# Patient Record
Sex: Female | Born: 2000 | Race: White | Hispanic: No | Marital: Single | Smoking: Never smoker
Health system: Southern US, Community
[De-identification: ages and names within clinical notes are randomized; demographics above are authoritative.]

---
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
## 2022-05-05 IMAGING — CR DG ABDOMEN 2V
1 series · 3 of 3 positions shown · non-contrast
Comparison: None.

CLINICAL DATA: Pain

EXAM:
ABDOMEN - 2 VIEW

[Series 1: dg abd 2 views · 0.14mm/px · 3 of 3 slices shown]
[im 1/3]
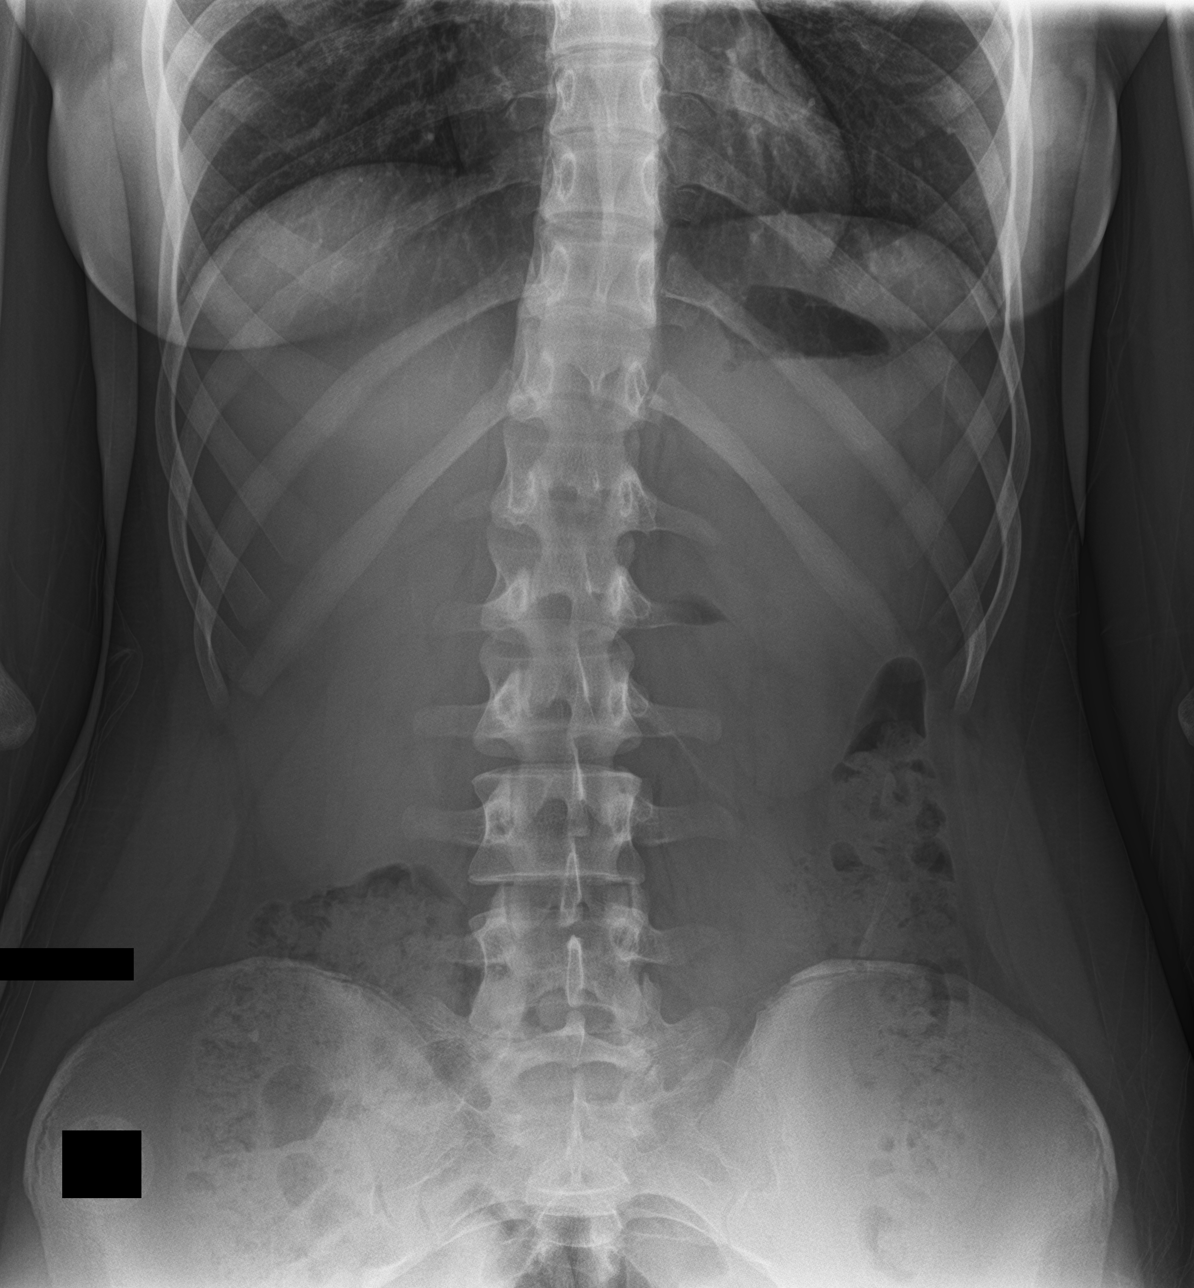
[im 2/3]
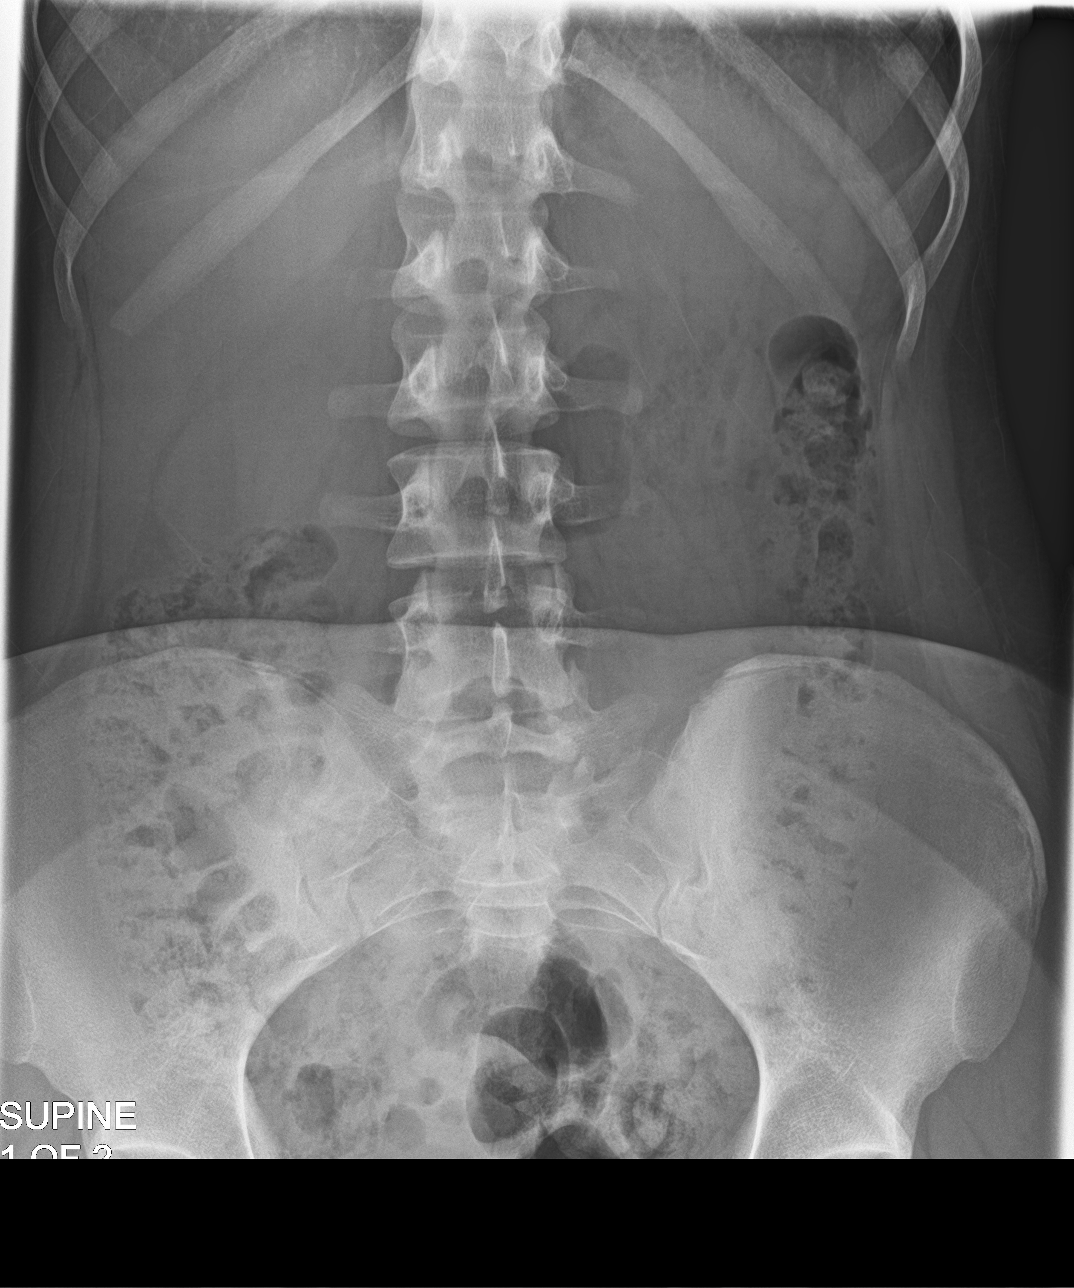
[im 3/3]
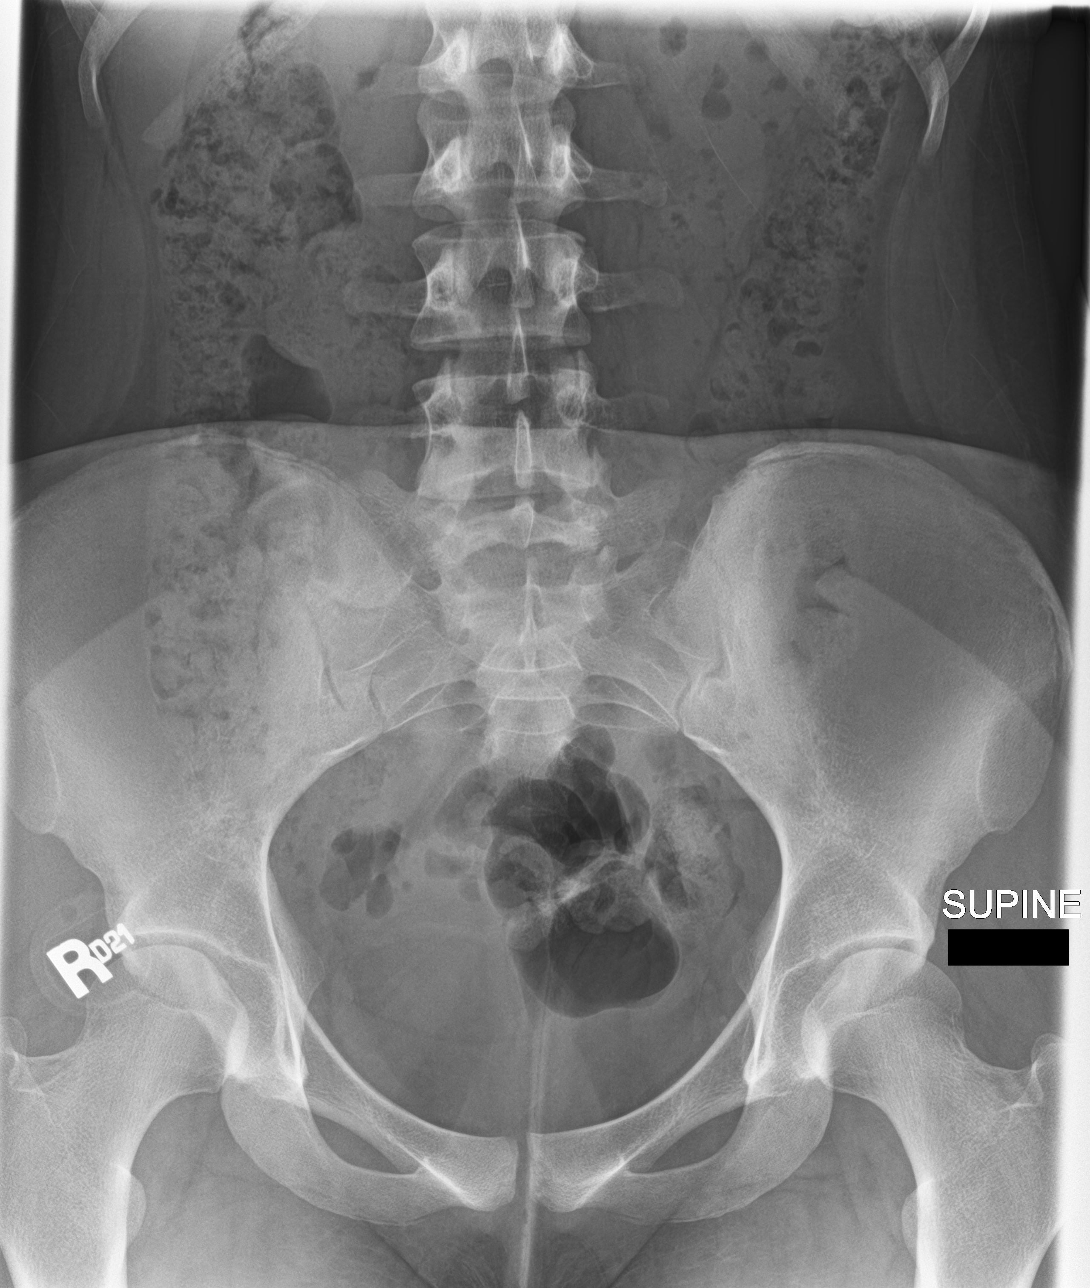

[3 of 3 positions shown; findings below may reference images not displayed]

FINDINGS: Air and stool-filled nondilated loops of bowel. Moderate to large
colonic stool burden diffusely throughout the colon. No free air.
Visualized lung bases are unremarkable. Riblets at T12 with partial
sacralization of L5 with a RIGHT-sided assimilation joint.
IMPRESSION: Nonobstructive bowel gas pattern. Moderate to large colonic stool
burden diffusely throughout the colon.

## 2022-07-28 ENCOUNTER — Other Ambulatory Visit: Payer: Self-pay

## 2022-07-28 ENCOUNTER — Encounter: Payer: Self-pay | Admitting: Family Medicine

## 2022-07-28 ENCOUNTER — Ambulatory Visit (INDEPENDENT_AMBULATORY_CARE_PROVIDER_SITE_OTHER): Payer: BC Managed Care – PPO | Admitting: Family Medicine

## 2022-07-28 VITALS — BP 121/82 | HR 91 | Temp 98.2°F | Ht 66.93 in | Wt 168.0 lb

## 2022-07-28 DIAGNOSIS — R112 Nausea with vomiting, unspecified: Secondary | ICD-10-CM | POA: Diagnosis not present

## 2022-07-28 DIAGNOSIS — F419 Anxiety disorder, unspecified: Secondary | ICD-10-CM

## 2022-07-28 DIAGNOSIS — F32A Depression, unspecified: Secondary | ICD-10-CM | POA: Diagnosis not present

## 2022-07-28 MED ORDER — ONDANSETRON HCL 4 MG PO TABS: 4.0000 mg | ORAL_TABLET | Freq: Three times a day (TID) | ORAL | 2 refills | Status: DC | PRN

## 2022-07-28 NOTE — Progress Notes (Signed)
[address] [place], [place] [address] Phone: [phone] Fax: [phone]   Office Visit Note  Patient Name: Megan Vang  Date of [id number]  Med Rec number [id number]  Date of Service: 07/28/2022  Patient has no allergy information on record.  No chief complaint on file.    Has history of anxiety and sees a therapist but anxiety getting worse Nauseous and vomiting Has missed some classes due to the nausea  Has had nausea like this before with anxiety since beginning of high school but has never been this bad before - eating may make it worse, can last weeks or months Has been taking lexapro for about 18 months - sees her primary care for this Even if controls anxiety it does not help the nausea  Lexapro helps the depression but not the anxiety Sees therapist virtually  Nausea may wake her at night Has tried zofran, dramamine, (zofran helped)   Appetite varies depending on anxiety - also has IBS (mixed)  Has not had blood work for about 3 years    Current Medication:  No outpatient encounter medications on file as of 07/28/2022.   No facility-administered encounter medications on file as of 07/28/2022.      Medical History: Anxiety and depression IBS (mixed)   Vital Signs: BP 121/82   Pulse 91   Temp 98.2 F (36.8 C) (Tympanic)   Ht 5' 6.93" (1.7 m)   Wt 168 lb (76.2 kg)   SpO2 98%   BMI 26.37 kg/m    Review of Systems  Constitutional:  Positive for activity change and appetite change.  Gastrointestinal:  Positive for nausea and vomiting. Negative for constipation and diarrhea.  Psychiatric/Behavioral:  The patient is nervous/anxious.     Physical Exam Vitals reviewed.  Constitutional:      Appearance: Normal appearance.  Abdominal:     General: Abdomen is flat. Bowel sounds are normal. There is no distension.     Palpations: Abdomen is soft. There is no mass.     Tenderness: There is no abdominal tenderness.  There is no guarding.  Neurological:     Mental Status: She is alert.  Psychiatric:        Behavior: Behavior normal.        Thought Content: Thought content normal.        Judgment: Judgment normal.     Assessment/Plan:  1. Nausea and vomiting, unspecified vomiting type Appears secondary to anxiety - ondansetron (ZOFRAN) 4 MG tablet; Take 1 tablet (4 mg total) by mouth every 8 (eight) hours as needed for nausea or vomiting.  Dispense: 20 tablet; Refill: 2 - Comp Met (CMET)  2. Anxiety disorder, unspecified type Not controlled with lexapro - TSH  3. Depression, unspecified depression type Seems to be responding to lexapro - Vitamin D (25 hydroxy) - CBC w/Diff      General Counseling: [name] verbalizes understanding of the findings of todays visit and agrees with plan of treatment. I have discussed any further diagnostic evaluation that may be needed or ordered today. We also reviewed her medications today. she has been encouraged to call the office with any questions or concerns that should arise related to todays visit.    No orders of the defined types were placed in this encounter.   No orders of the defined types were placed in this encounter.   Time spent:20 Minutes   Dr Evette Doffing Kinda Pottle AB[facility] University Physician

## 2022-07-29 LAB — CBC WITH DIFFERENTIAL/PLATELET
Basophils Absolute: 0 x10E3/uL (ref 0.0–0.2)
Basos: 1 %
EOS (ABSOLUTE): 0 x10E3/uL (ref 0.0–0.4)
Eos: 1 %
Hematocrit: 40.9 % (ref 34.0–46.6)
Hemoglobin: 14 g/dL (ref 11.1–15.9)
Immature Grans (Abs): 0 x10E3/uL (ref 0.0–0.1)
Immature Granulocytes: 0 %
Lymphocytes Absolute: 2.5 x10E3/uL (ref 0.7–3.1)
Lymphs: 33 %
MCH: 29.6 pg (ref 26.6–33.0)
MCHC: 34.2 g/dL (ref 31.5–35.7)
MCV: 87 fL (ref 79–97)
Monocytes Absolute: 0.6 x10E3/uL (ref 0.1–0.9)
Monocytes: 7 %
Neutrophils Absolute: 4.4 x10E3/uL (ref 1.4–7.0)
Neutrophils: 58 %
Platelets: 295 x10E3/uL (ref 150–450)
RBC: 4.73 x10E6/uL (ref 3.77–5.28)
RDW: 12.2 % (ref 11.7–15.4)
WBC: 7.5 x10E3/uL (ref 3.4–10.8)

## 2022-07-29 LAB — COMPREHENSIVE METABOLIC PANEL WITH GFR
ALT: 7 IU/L (ref 0–32)
AST: 13 IU/L (ref 0–40)
Albumin/Globulin Ratio: 1.3 (ref 1.2–2.2)
Albumin: 4.1 g/dL (ref 4.0–5.0)
Alkaline Phosphatase: 64 IU/L (ref 42–106)
BUN/Creatinine Ratio: 12 (ref 9–23)
BUN: 11 mg/dL (ref 6–20)
Bilirubin Total: 0.2 mg/dL (ref 0.0–1.2)
CO2: 18 mmol/L — ABNORMAL LOW (ref 20–29)
Calcium: 9.6 mg/dL (ref 8.7–10.2)
Chloride: 103 mmol/L (ref 96–106)
Creatinine, Ser: 0.92 mg/dL (ref 0.57–1.00)
Globulin, Total: 3.1 g/dL (ref 1.5–4.5)
Glucose: 108 mg/dL — ABNORMAL HIGH (ref 70–99)
Potassium: 4 mmol/L (ref 3.5–5.2)
Sodium: 139 mmol/L (ref 134–144)
Total Protein: 7.2 g/dL (ref 6.0–8.5)
eGFR: 91 mL/min/1.73 (ref >59)

## 2022-07-29 LAB — TSH: TSH: 1.43 u[IU]/mL (ref 0.450–4.500)

## 2022-07-29 LAB — VITAMIN D 25 HYDROXY (VIT D DEFICIENCY, FRACTURES): Vit D, 25-Hydroxy: 52.6 ng/mL (ref 30.0–100.0)

## 2022-10-07 ENCOUNTER — Ambulatory Visit (INDEPENDENT_AMBULATORY_CARE_PROVIDER_SITE_OTHER): Payer: BC Managed Care – PPO | Admitting: Oncology

## 2022-10-07 VITALS — BP 120/60 | HR 71 | Temp 97.9°F | Resp 18 | Ht 66.0 in | Wt 171.0 lb

## 2022-10-07 DIAGNOSIS — Z30013 Encounter for initial prescription of injectable contraceptive: Secondary | ICD-10-CM | POA: Diagnosis not present

## 2022-10-07 DIAGNOSIS — Z3042 Encounter for surveillance of injectable contraceptive: Secondary | ICD-10-CM

## 2022-10-07 MED ORDER — MEDROXYPROGESTERONE ACETATE 150 MG/ML IM SUSP: 150.0000 mg | Freq: Once | INTRAMUSCULAR | Status: AC

## 2022-10-07 MED ORDER — MEDROXYPROGESTERONE ACETATE 150 MG/ML IM SUSP: 150.0000 mg | Freq: Once | INTRAMUSCULAR | 0 refills | Status: DC

## 2022-10-07 NOTE — Progress Notes (Signed)
[facility] [address] [place], [place] [address] Phone: [phone] Fax: [phone]   Office Visit Note  Patient Name: Megan Vang  Date of [id number]  Med Rec number [id number]  Date of Service: 10/07/2022  Kiwi extract and Peanut-containing drug products  No chief complaint on file.  Patient is an 21 y.o. student here for first Depo shot.  See previous office visit from earlier today.  Currently on menstrual cycle.  Believes she is on day 3.  Not currently sexually active.  Education provided while in clinic.  All questions answered.  Current Medication:  Outpatient Encounter Medications as of 10/07/2022  Medication Sig   escitalopram (LEXAPRO) 10 MG tablet Take 10 mg by mouth daily.   medroxyPROGESTERone (DEPO-PROVERA) 150 MG/ML injection Inject 1 mL (150 mg total) into the muscle once for 1 dose.   ondansetron (ZOFRAN) 4 MG tablet Take 1 tablet (4 mg total) by mouth every 8 (eight) hours as needed for nausea or vomiting.   No facility-administered encounter medications on file as of 10/07/2022.    Medical History: No past medical history on file.  Vital Signs: There were no vitals taken for this visit.  ROS: As per HPI.  All other pertinent ROS negative.     Review of Systems  Unable to perform ROS: Other    Physical Exam Vitals reviewed.    No results found for this or any previous visit (from the past 24 hour(s)).  Assessment/Plan:  1. Encounter for Depo-Provera contraception -150 mg Depo-Provera shot given while in clinic today. -Tolerated well. -Discussed returning in 90 days.  When due for next injection will be from 12/23/2022 to 01/05/2023. -Please let me know if you are unable to tolerate based on symptoms over the next 2 to 3 months.  Discussed side effects at previous visit.  Please let me know if you need anything additional prior to your next visit.  - medroxyPROGESTERone (DEPO-PROVERA) injection 150 mg    Disposition-return to clinic between 12/23/2022 and 01/05/2023 for next injection or before as needed.  General Counseling: Megan Vang verbalizes understanding of the findings of todays visit and agrees with plan of treatment. I have discussed any further diagnostic evaluation that may be needed or ordered today. We also reviewed her medications today. she has been encouraged to call the office with any questions or concerns that should arise related to todays visit.   No orders of the defined types were placed in this encounter.   No orders of the defined types were placed in this encounter.   I spent 10 minutes dedicated to the care of this patient (face-to-face and non-face-to-face) on the date of the encounter to include what is described in the assessment and plan.   Durenda Hurt, NP 10/07/2022 12:22 PM

## 2022-10-07 NOTE — Progress Notes (Signed)
[address] [place], [place] [address] Phone: [phone] Fax: [phone]   Office Visit Note  Patient Name: Megan Vang  Date of [id number]  Med Rec number [id number]  Date of Service: 10/07/2022  Kiwi extract and Peanut-containing drug products  No chief complaint on file.  Patient is an 21 y.o. student here to discuss starting depo. Has been on Yaz for 4 years. Stopped it about 2 months. Stopped BC she ran out and wanted to try something different.  States she is tired of taking the pill. Discussed implanted birth control versus IUD versus injectable and would prefer to try injectable.  Currently not sexually active.  Has not had sex in greater than 1 year.  Currently on menstrual cycle.  On day 2 or 3.  Would like to start as soon as possible.  Wants to be on contraception for cycle control.   Denies personal or family history of migraines or blood clots.  Has never had any issues with her oral birth control.  Current Medication:  Outpatient Encounter Medications as of 10/07/2022  Medication Sig   escitalopram (LEXAPRO) 10 MG tablet Take 10 mg by mouth daily.   ondansetron (ZOFRAN) 4 MG tablet Take 1 tablet (4 mg total) by mouth every 8 (eight) hours as needed for nausea or vomiting.   YAZ 3-0.02 MG tablet Take 1 tablet by mouth daily.   No facility-administered encounter medications on file as of 10/07/2022.      Medical History: No past medical history on file.   Vital Signs: There were no vitals taken for this visit.  ROS: As per HPI.  All other pertinent ROS negative.     Review of Systems  Constitutional: Negative.     Physical Exam Vitals reviewed.  Constitutional:      Appearance: Normal appearance.  Neurological:     General: No focal deficit present.     Mental Status: She is alert and oriented to person, place, and time.    No results found for this or any previous visit (from the past 24  hour(s)).  Assessment/Plan: 1. Encounter for initial prescription of injectable contraceptive -Discussed starting Depo-Provera with cycle.  Student open to starting today given she is leaving to go home tomorrow.  Depo-Provera prescription sent to [facility].  Student to return later today to have injection.  Discussed side effects of the birth control including nause and weight gain.  Provided written and verbal instructions on administration and side effects via MyChart and in clinic.  Will return to clinic after first injection approximately 90 days from initial injection.  You will have a 2-week window to get your next injection.  Your window will be between 12/23/2022-01/05/2023.  Patient Education Side effects that you should report to your care team as soon as possible: Allergic reactions--skin rash, itching, hives, swelling of the face, lips, tongue, or throat Blood clot--pain, swelling, or warmth in the leg, shortness of breath, chest pain Gallbladder problems--severe stomach pain, nausea, vomiting, fever Increase in blood pressure Liver injury--right upper belly pain, loss of appetite, nausea, light-colored stool, dark yellow or brown urine, yellowing skin or eyes, unusual weakness or fatigue New or worsening migraines or headaches Seizures Stroke--sudden numbness or weakness of the face, arm, or leg, trouble speaking, confusion, trouble walking, loss of balance or coordination, dizziness, severe headache, change in vision Unusual vaginal discharge, itching, or odor Worsening mood, feelings of depression  Side effects that usually do not require medical attention (report  to your care team if they continue or are bothersome): Breast pain or tenderness Dark patches of the skin on the face or other sun-exposed areas Irregular menstrual cycles or spotting Nausea Weight gain  - medroxyPROGESTERone (DEPO-PROVERA) 150 MG/ML injection; Inject 1 mL (150 mg total) into the muscle once for 1 dose.   Dispense: 1 mL; Refill: 0   Disposition-return to clinic later this afternoon for injection.  Discussed returning between 12/23/2022-01/05/2023 for next injection.  General Counseling: Kamariah verbalizes understanding of the findings of todays visit and agrees with plan of treatment. I have discussed any further diagnostic evaluation that may be needed or ordered today. We also reviewed her medications today. she has been encouraged to call the office with any questions or concerns that should arise related to todays visit.   No orders of the defined types were placed in this encounter.   No orders of the defined types were placed in this encounter.   I spent 20 minutes dedicated to the care of this patient (face-to-face and non-face-to-face) on the date of the encounter to include what is described in the assessment and plan.   Durenda Hurt, NP 10/07/2022 10:23 AM

## 2022-12-28 ENCOUNTER — Other Ambulatory Visit: Payer: Self-pay | Admitting: Oncology

## 2022-12-28 DIAGNOSIS — Z30013 Encounter for initial prescription of injectable contraceptive: Secondary | ICD-10-CM

## 2022-12-30 MED ORDER — MEDROXYPROGESTERONE ACETATE 150 MG/ML IM SUSP: 150.0000 mg | Freq: Once | INTRAMUSCULAR | 0 refills | Status: DC

## 2023-01-05 ENCOUNTER — Ambulatory Visit (INDEPENDENT_AMBULATORY_CARE_PROVIDER_SITE_OTHER): Payer: BC Managed Care – PPO | Admitting: Oncology

## 2023-01-05 ENCOUNTER — Encounter: Payer: Self-pay | Admitting: Oncology

## 2023-01-05 VITALS — BP 108/68 | HR 73 | Temp 98.1°F | Resp 18 | Ht 66.0 in | Wt 171.0 lb

## 2023-01-05 DIAGNOSIS — Z3042 Encounter for surveillance of injectable contraceptive: Secondary | ICD-10-CM

## 2023-01-05 MED ORDER — MEDROXYPROGESTERONE ACETATE 150 MG/ML IM SUSP
150.0000 mg | Freq: Once | INTRAMUSCULAR | Status: AC
  Administered 2023-01-05: 150 mg via INTRAMUSCULAR

## 2023-01-05 NOTE — Progress Notes (Signed)
[address]. [place], [place] [address] Phone: [phone] Fax: [phone]   Office Visit Note  Patient Name: Megan Vang  Date of [id number]  Med Rec number [id number]  Date of Service: 01/05/2023  Kiwi extract and Peanut-containing drug products  No chief complaint on file.  Patient is an 22 y.o. student here for second Depo shot. Last depo injection was 10/07/22. Next due between 2/1-2/15/24.  States she had a menstrual cycle mid January that lasted for 13 days but otherwise has not had a cycle.  No new concerning symptoms.  Feels like she tolerated the first 3 months well.  Current Medication:  Outpatient Encounter Medications as of 01/05/2023  Medication Sig   escitalopram (LEXAPRO) 10 MG tablet Take 10 mg by mouth daily.   medroxyPROGESTERone (DEPO-PROVERA) 150 MG/ML injection Inject 1 mL (150 mg total) into the muscle once for 1 dose.   ondansetron (ZOFRAN) 4 MG tablet Take 1 tablet (4 mg total) by mouth every 8 (eight) hours as needed for nausea or vomiting.   Facility-Administered Encounter Medications as of 01/05/2023  Medication   medroxyPROGESTERone (DEPO-PROVERA) injection 150 mg    Medical History: No past medical history on file.  Vital Signs: There were no vitals taken for this visit.  ROS: As per HPI.  All other pertinent ROS negative.     Review of Systems  Constitutional: Negative.     Physical Exam Vitals reviewed.  Constitutional:      Appearance: Normal appearance.  Neurological:     Mental Status: She is alert and oriented to person, place, and time.    No results found for this or any previous visit (from the past 24 hour(s)).  Assessment/Plan:  1. Encounter for Depo-Provera contraception -150 mg Depo-Provera shot given while in clinic today. -Tolerated well. -Discussed returning in 90 days.  Due for next injection from 5/3-5/15.  -New Rx sent.   - medroxyPROGESTERone (DEPO-PROVERA) injection 150 mg    Disposition-return to clinic between 5/3-5/17 for next injection or before as needed.  General Counseling: Megan Vang verbalizes understanding of the findings of todays visit and agrees with plan of treatment. I have discussed any further diagnostic evaluation that may be needed or ordered today. We also reviewed her medications today. she has been encouraged to call the office with any questions or concerns that should arise related to todays visit.   No orders of the defined types were placed in this encounter.   No orders of the defined types were placed in this encounter.   I spent 20 minutes dedicated to the care of this patient (face-to-face and non-face-to-face) on the date of the encounter to include what is described in the assessment and plan.   Faythe Casa, NP 01/05/2023 11:12 AM

## 2023-01-07 ENCOUNTER — Ambulatory Visit: Payer: BC Managed Care – PPO | Admitting: Oncology

## 2023-01-13 ENCOUNTER — Encounter: Payer: Self-pay | Admitting: Family Medicine

## 2023-01-13 ENCOUNTER — Ambulatory Visit: Payer: BC Managed Care – PPO | Admitting: Family Medicine

## 2023-01-14 ENCOUNTER — Ambulatory Visit (INDEPENDENT_AMBULATORY_CARE_PROVIDER_SITE_OTHER): Payer: BC Managed Care – PPO | Admitting: Oncology

## 2023-01-14 ENCOUNTER — Encounter: Payer: Self-pay | Admitting: Oncology

## 2023-01-14 ENCOUNTER — Ambulatory Visit: Payer: BC Managed Care – PPO | Admitting: Oncology

## 2023-01-14 VITALS — BP 122/60 | HR 97 | Temp 96.3°F | Resp 18 | Ht 66.0 in | Wt 171.0 lb

## 2023-01-14 DIAGNOSIS — R0781 Pleurodynia: Secondary | ICD-10-CM

## 2023-01-14 MED ORDER — PREDNISONE 20 MG PO TABS: 40.0000 mg | ORAL_TABLET | Freq: Every day | ORAL | 0 refills | Status: DC

## 2023-01-14 NOTE — Progress Notes (Signed)
[address] [place], [place] [address] Phone: [phone] Fax: [phone]   Office Visit Note  Patient Name: Megan Vang  Date of [id number]  Med Rec number [id number]  Date of Service: 01/14/2023  Kiwi extract and Peanut-containing drug products  Chief Complaint  Patient presents with   Chest Pain   Patient is an 22 y.o. student here for complaints of right sided rib pain that started last Saturday. Had flu feb 4/5 and has had a lingering cough since then. Cough has improved some this week. Had some heavy coughing spells last week. Pain starts under right breast extending through axillary area. Pain is worse when she takes deep breaths, coughing and having trouble laying on her right side. Has been taking advil (2 tabs) every few hours but feels like it didn't help much. No fevers. Has been waking up with headaches.  No obvious injury.  Is on [employer] and swims an hour each day.  Thought it might be musculoskeletal but symptoms have not improved.  Denies any drug allergies.  No recent antibiotics.  Current Medication:  Outpatient Encounter Medications as of 01/14/2023  Medication Sig   escitalopram (LEXAPRO) 10 MG tablet Take 10 mg by mouth daily.   medroxyPROGESTERone (DEPO-PROVERA) 150 MG/ML injection Inject 1 mL (150 mg total) into the muscle once for 1 dose.   ondansetron (ZOFRAN) 4 MG tablet Take 1 tablet (4 mg total) by mouth every 8 (eight) hours as needed for nausea or vomiting.   Facility-Administered Encounter Medications as of 01/14/2023  Medication   medroxyPROGESTERone (DEPO-PROVERA) injection 150 mg      Medical History: History reviewed. No pertinent past medical history.   Vital Signs: BP 122/60   Pulse 97   Temp (!) 96.3 F (35.7 C) (Tympanic)   Resp 18   Ht '5\' 6"'$  (1.676 m)   Wt 171 lb (77.6 kg)   SpO2 99%   BMI 27.60 kg/m   ROS: As per HPI.  All other pertinent ROS negative.     Review of Systems   Constitutional:  Positive for fatigue. Negative for fever.  HENT:  Negative for congestion, ear pain, sinus pressure, sinus pain and sore throat.   Respiratory:  Positive for cough and shortness of breath (with exertion).   Gastrointestinal:  Negative for diarrhea, nausea and vomiting.  Neurological:  Positive for headaches. Negative for dizziness.  Hematological:  Negative for adenopathy.    Physical Exam Vitals reviewed.  Constitutional:      Appearance: She is well-developed.  Musculoskeletal:     Comments: Right pin point tenderness anetriorly that extends lateral between T4 and T5 to under right breast.   Neurological:     Mental Status: She is alert.     No results found for this or any previous visit (from the past 24 hour(s)).  Assessment/Plan: 1. Rib pain -Exam most consistent with inflammation of cartilage in between T4 and T5 rib from recent upper respiratory infection.  Doubt pneumonia or bronchitis given coughing has improved.  No fever.  Vital signs stable.  Discussed trying high-dose ibuprofen 3-4 tabs every 6-8 hours while awake versus prednisone 40 mg each morning with breakfast x 5 days.  Discussed pros and cons to both.  Student does not think that Advil helped much and would like to try steroids.  Discussed side effects to steroids and how and when to take.  - predniSONE (DELTASONE) 20 MG tablet; Take 2 tablets (40 mg total)  by mouth daily with breakfast.  Dispense: 10 tablet; Refill: 0  Disposition-return to clinic as needed or failure to improve.  General Counseling: Megan Vang verbalizes understanding of the findings of todays visit and agrees with plan of treatment. I have discussed any further diagnostic evaluation that may be needed or ordered today. We also reviewed her medications today. she has been encouraged to call the office with any questions or concerns that should arise related to todays visit.   No orders of the defined types were placed in this  encounter.   No orders of the defined types were placed in this encounter.   I spent 20 minutes dedicated to the care of this patient (face-to-face and non-face-to-face) on the date of the encounter to include what is described in the assessment and plan.   Faythe Casa, NP 01/14/2023 9:32 AM

## 2023-01-17 ENCOUNTER — Encounter: Payer: Self-pay | Admitting: Oncology

## 2023-01-18 ENCOUNTER — Ambulatory Visit
Admission: RE | Admit: 2023-01-18 | Discharge: 2023-01-18 | Disposition: A | Discharge status: Home / Self Care | Payer: BC Managed Care – PPO | Attending: Oncology | Admitting: Oncology

## 2023-01-18 ENCOUNTER — Other Ambulatory Visit: Payer: Self-pay | Admitting: Oncology

## 2023-01-18 ENCOUNTER — Ambulatory Visit
Admission: RE | Admit: 2023-01-18 | Discharge: 2023-01-18 | Disposition: A | Discharge status: Home / Self Care | Payer: BC Managed Care – PPO | Source: Ambulatory Visit | Attending: Oncology | Admitting: Oncology

## 2023-01-18 DIAGNOSIS — R0781 Pleurodynia: Secondary | ICD-10-CM

## 2023-01-24 ENCOUNTER — Other Ambulatory Visit: Payer: Self-pay | Admitting: Oncology

## 2023-01-24 DIAGNOSIS — Z30013 Encounter for initial prescription of injectable contraceptive: Secondary | ICD-10-CM

## 2023-01-31 ENCOUNTER — Emergency Department
Admission: EM | Admit: 2023-01-31 | Discharge: 2023-02-01 | Disposition: A | Discharge status: Home / Self Care | Payer: BC Managed Care – PPO | Attending: Emergency Medicine | Admitting: Emergency Medicine

## 2023-01-31 ENCOUNTER — Other Ambulatory Visit: Payer: Self-pay

## 2023-01-31 ENCOUNTER — Encounter: Payer: Self-pay | Admitting: *Deleted

## 2023-01-31 DIAGNOSIS — T782XXA Anaphylactic shock, unspecified, initial encounter: Secondary | ICD-10-CM | POA: Diagnosis not present

## 2023-01-31 DIAGNOSIS — T7840XA Allergy, unspecified, initial encounter: Secondary | ICD-10-CM | POA: Diagnosis present

## 2023-01-31 MED ORDER — SODIUM CHLORIDE 0.9 % IV BOLUS
1000.0000 mL | Freq: Once | INTRAVENOUS | Status: AC
  Administered 2023-01-31: 1000 mL via INTRAVENOUS

## 2023-01-31 MED ORDER — DIPHENHYDRAMINE HCL 50 MG/ML IJ SOLN
50.0000 mg | Freq: Once | INTRAMUSCULAR | Status: AC
  Administered 2023-01-31: 50 mg via INTRAVENOUS
  Filled 2023-01-31: qty 1

## 2023-01-31 MED ORDER — DIPHENHYDRAMINE HCL 50 MG PO TABS: 50.0000 mg | ORAL_TABLET | Freq: Three times a day (TID) | ORAL | 0 refills | Status: DC

## 2023-01-31 MED ORDER — EPINEPHRINE 0.3 MG/0.3ML IJ SOAJ: 0.3000 mg | INTRAMUSCULAR | 1 refills | Status: AC | PRN

## 2023-01-31 MED ORDER — METHYLPREDNISOLONE SODIUM SUCC 125 MG IJ SOLR
125.0000 mg | Freq: Once | INTRAMUSCULAR | Status: AC
  Administered 2023-01-31: 125 mg via INTRAVENOUS
  Filled 2023-01-31: qty 2

## 2023-01-31 MED ORDER — FAMOTIDINE 20 MG PO TABS: 20.0000 mg | ORAL_TABLET | Freq: Two times a day (BID) | ORAL | 0 refills | Status: DC

## 2023-01-31 MED ORDER — EPINEPHRINE 0.3 MG/0.3ML IJ SOAJ
0.3000 mg | Freq: Once | INTRAMUSCULAR | Status: AC
  Administered 2023-01-31: 0.3 mg via INTRAMUSCULAR
  Filled 2023-01-31: qty 0.3

## 2023-01-31 MED ORDER — PREDNISONE 20 MG PO TABS: 40.0000 mg | ORAL_TABLET | Freq: Every day | ORAL | 0 refills | Status: AC

## 2023-01-31 MED ORDER — FAMOTIDINE IN NACL 20-0.9 MG/50ML-% IV SOLN
20.0000 mg | Freq: Once | INTRAVENOUS | Status: AC
  Administered 2023-01-31: 20 mg via INTRAVENOUS
  Filled 2023-01-31: qty 50

## 2023-01-31 NOTE — ED Provider Notes (Signed)
[facility] Provider Note    Event Date/Time   First MD Initiated Contact with Patient 01/31/23 2039     (approximate)   History   Allergic Reaction   HPI  Megan Vang is a 22 y.o. female  here with allergic reaction. Pt states that she ate pesto today with a sandwhich. 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 Shortly after she began to feel her lips swelling and a red, itchy rash on her face. It mildly improved but then returned along with sensation of tongue swelling, SOB, and diffuse rash throughout her body. She subsequently presents for evaluation. Denies h/o anaphylaxis. No other new exposures.       Physical Exam   Triage Vital Signs: ED Triage Vitals  Enc Vitals Group     BP 01/31/23 2034 (!) 149/107     Pulse Rate 01/31/23 2034 (!) 119     Resp 01/31/23 2034 20     Temp 01/31/23 2034 98 F (36.7 C)     Temp src --      SpO2 01/31/23 2034 97 %     Weight 01/31/23 2035 170 lb (77.1 kg)     Height 01/31/23 2035 '5\' 6"'$  (1.676 m)     Head Circumference --      Peak Flow --      Pain Score 01/31/23 2035 0     Pain Loc --      Pain Edu? --      Excl. in [place]? --     Most recent vital signs: Vitals:   01/31/23 2130 01/31/23 2200  BP: (!) 112/96 131/72  Pulse: 95 (!) 107  Resp: 17 18  Temp:    SpO2: 100% 100%     General: Awake, no distress.  CV:  Good peripheral perfusion. Tachycardic. Resp:  Normal work of breathing. Slight tachypnea noted but no overt wheezing. No stridor. Abd:  No distention. No tenderness. Other:  Diffuse erythematous rash with urticaria. Mild anterior tongue edema, posterior pharynx patent, uvula nonedematous.   ED Results / Procedures / Treatments   Labs (all labs ordered are listed, but only abnormal results are displayed) Labs Reviewed - No data to display   EKG    RADIOLOGY    I also independently reviewed and agree with radiologist  interpretations.   PROCEDURES:  Critical Care performed: Yes, see critical care procedure note(s)  .Critical Care  Performed by: Duffy Bruce, MD Authorized by: Duffy Bruce, MD   Critical care provider statement:    Critical care time (minutes):  30   Critical care time was exclusive of:  Separately billable procedures and treating other patients   Critical care was necessary to treat or prevent imminent or life-threatening deterioration of the following conditions:  Circulatory failure, cardiac failure and respiratory failure   Critical care was time spent personally by me on the following activities:  Development of treatment plan with patient or surrogate, discussions with consultants, evaluation of patient's response to treatment, examination of patient, ordering and review of laboratory studies, ordering and review of radiographic studies, ordering and performing treatments and interventions, pulse oximetry, re-evaluation of patient's condition and review of old charts     MEDICATIONS ORDERED IN ED: Medications  EPINEPHrine (EPI-PEN) injection 0.3 mg (0.3 mg Intramuscular Given 01/31/23 2048)  methylPREDNISolone sodium succinate (SOLU-MEDROL) 125 mg/2 mL injection 125 mg (125 mg Intravenous Given 01/31/23 2106)  diphenhydrAMINE (BENADRYL) injection 50 mg (50 mg  Intravenous Given 01/31/23 2106)  famotidine (PEPCID) IVPB 20 mg premix (0 mg Intravenous Stopped 01/31/23 2137)  sodium chloride 0.9 % bolus 1,000 mL (0 mLs Intravenous Stopped 01/31/23 2253)     IMPRESSION / MDM / ASSESSMENT AND PLAN / ED COURSE  I reviewed the triage vital signs and the nursing notes.                              Differential diagnosis includes, but is not limited to, anaphylaxis, severe systemic allergic rxn, viral urticaria  Patient's presentation is most consistent with acute presentation with potential threat to life or bodily function.  The patient is on the cardiac monitor to evaluate for  evidence of arrhythmia and/or significant heart rate changes  22 yo F here with anaphylaxis 2/2 pine nut exposure in pesto. Pt with skin reaction, SOB on arrival and tachycardia with BP 90s. Epipen given with resolution of sx. IV steroids and antihistamines given. Monitored x 3 hours after epi with no recurrence of sx. Will d/c with short course of antihistamines and steroids, and epipen.    FINAL CLINICAL IMPRESSION(S) / ED DIAGNOSES   Final diagnoses:  Anaphylaxis, initial encounter     Rx / DC Orders   ED Discharge Orders          Ordered    EPINEPHrine 0.3 mg/0.3 mL IJ SOAJ injection  As needed        01/31/23 2314    predniSONE (DELTASONE) 20 MG tablet  Daily        01/31/23 2314    diphenhydrAMINE (BENADRYL) 50 MG tablet  3 times daily        01/31/23 2314    famotidine (PEPCID) 20 MG tablet  2 times daily        01/31/23 2314             Note:  This document was prepared using Dragon voice recognition software and may include unintentional dictation errors.   Duffy Bruce, MD 02/01/23 [phone]

## 2023-01-31 NOTE — Discharge Instructions (Addendum)
Take the prednisone as prescribed in the mornings.  Take Benadryl three times a day for 1-2 days, then as needed.  Take Pepcid twice a day.  Keep the Epipen on you at all times, and carry a spare with you when traveling/going out.  Avoid Pesto and any pine-nut containing products

## 2023-01-31 NOTE — ED Triage Notes (Signed)
Pt has an allergic reaction to possible nuts.  Pt ate pesto at 1815,  pt red all over with itching.   Pt reports tongue swelling.  Pt alert.

## 2023-02-01 NOTE — ED Notes (Signed)
Pt A&O x4, no obvious distress noted, respirations regular/unlabored. Pt verbalizes understanding of discharge instructions. Pt able to ambulate from ED independently.   

## 2023-03-10 ENCOUNTER — Encounter: Payer: Self-pay | Admitting: Oncology

## 2023-03-10 ENCOUNTER — Ambulatory Visit (INDEPENDENT_AMBULATORY_CARE_PROVIDER_SITE_OTHER): Payer: BC Managed Care – PPO | Admitting: Oncology

## 2023-03-10 VITALS — BP 128/80 | HR 94 | Temp 98.4°F | Ht 68.0 in | Wt 176.0 lb

## 2023-03-10 DIAGNOSIS — N898 Other specified noninflammatory disorders of vagina: Secondary | ICD-10-CM

## 2023-03-10 MED ORDER — DOXYCYCLINE HYCLATE 100 MG PO TABS: 100.0000 mg | ORAL_TABLET | Freq: Two times a day (BID) | ORAL | 0 refills | Status: DC

## 2023-03-10 NOTE — Progress Notes (Signed)
[facility] [address] [place], [place] [address] Phone: [phone] Fax: [phone]   Office Visit Note  Patient Name: Megan Vang  Date of [id number]  Med Rec number [id number]  Date of Service: 03/10/2023  Kiwi extract and Peanut-containing drug products  Chief Complaint  Patient presents with   Lump on vaginal area   Patient is an 21 y.o. student here for complaints of a "lump" on the left outer labia that she first noticed last November. States you can't see it from looking at it and can only feel it from the outside. Feels fuller then the right side and has become slightly tender when she touches it or sits down. Feels like it may have enlarged over the past few weeks. Has tried a warm compress. Mom has history of Bartholin cyst so wanted to come in an get it checked out. No fluctuant head.  No drainage.  Is going to see her gynecologist when she got home but because it appears larger, she wanted to get it checked out sooner.  Current Medication:  Outpatient Encounter Medications as of 03/10/2023  Medication Sig   diphenhydrAMINE (BENADRYL) 50 MG tablet Take 1 tablet (50 mg total) by mouth in the morning, at noon, and at bedtime for 2 days.   EPINEPHrine 0.3 mg/0.3 mL IJ SOAJ injection Inject 0.3 mg into the muscle as needed for anaphylaxis.   escitalopram (LEXAPRO) 10 MG tablet Take 10 mg by mouth daily.   famotidine (PEPCID) 20 MG tablet Take 1 tablet (20 mg total) by mouth 2 (two) times daily for 3 days.   medroxyPROGESTERone (DEPO-PROVERA) 150 MG/ML injection Inject 1 mL (150 mg total) into the muscle once for 1 dose.   ondansetron (ZOFRAN) 4 MG tablet Take 1 tablet (4 mg total) by mouth every 8 (eight) hours as needed for nausea or vomiting.   Facility-Administered Encounter Medications as of 03/10/2023  Medication   medroxyPROGESTERone (DEPO-PROVERA) injection 150 mg     Medical History: History reviewed. No pertinent past medical  history.  Vital Signs: BP 128/80   Pulse 94   Temp 98.4 F (36.9 C) (Tympanic)   Ht  (1.727 m)   Wt 176 lb (79.8 kg)   SpO2 99%   BMI 26.76 kg/m   ROS: As per HPI.  All other pertinent ROS negative.     Review of Systems  Genitourinary:  Positive for genital sores (Left side labia majora).    Physical Exam Vitals reviewed. Exam conducted with a chaperone present.  Constitutional:      Appearance: Normal appearance.  Genitourinary:    Labia:        Left: Lesion present.      Comments: Left sided labia majora fullness/firmness concerning for sebaceous cyst.  No fluctuant head.  No drainage.  No erythema or warmth. Neurological:     Mental Status: She is alert.     No results found for this or any previous visit (from the past 24 hour(s)).  Assessment/Plan: 1. Vaginal cyst -Cyst has been present for greater than 6 months.  Discussed with Dr. Andrey Spearman and given cyst has become painful and slightly larger would recommend treating with doxycycline 100 mg twice a day x 10 days.  Discussed taking with food.  Please let me know if you are unable to tolerate. -Follow-up with GYN when you get home for the summer mif symptoms return or do not fully resolve.  - doxycycline (VIBRA-TABS) 100 MG tablet; Take 1 tablet (  100 mg total) by mouth 2 (two) times daily.  Dispense: 20 tablet; Refill: 0  Disposition-RTC PRN   General Counseling: Kit verbalizes understanding of the findings of todays visit and agrees with plan of treatment. I have discussed any further diagnostic evaluation that may be needed or ordered today. We also reviewed her medications today. she has been encouraged to call the office with any questions or concerns that should arise related to todays visit.   No orders of the defined types were placed in this encounter.   No orders of the defined types were placed in this encounter.   I spent 20 minutes dedicated to the care of this patient (face-to-face and  non-face-to-face) on the date of the encounter to include what is described in the assessment and plan.   Durenda Hurt, NP 03/10/2023 1:19 PM

## 2023-03-29 ENCOUNTER — Other Ambulatory Visit: Payer: Self-pay | Admitting: Oncology

## 2023-03-29 DIAGNOSIS — Z30013 Encounter for initial prescription of injectable contraceptive: Secondary | ICD-10-CM

## 2023-03-29 MED ORDER — MEDROXYPROGESTERONE ACETATE 150 MG/ML IM SUSP: 150.0000 mg | Freq: Once | INTRAMUSCULAR | 0 refills | Status: DC

## 2023-03-31 ENCOUNTER — Encounter: Payer: Self-pay | Admitting: Oncology

## 2023-03-31 ENCOUNTER — Ambulatory Visit (INDEPENDENT_AMBULATORY_CARE_PROVIDER_SITE_OTHER): Payer: BC Managed Care – PPO | Admitting: Oncology

## 2023-03-31 DIAGNOSIS — N898 Other specified noninflammatory disorders of vagina: Secondary | ICD-10-CM

## 2023-03-31 DIAGNOSIS — Z3042 Encounter for surveillance of injectable contraceptive: Secondary | ICD-10-CM

## 2023-03-31 MED ORDER — CEPHALEXIN 500 MG PO CAPS: 500.0000 mg | ORAL_CAPSULE | Freq: Four times a day (QID) | ORAL | 0 refills | Status: DC

## 2023-03-31 MED ORDER — MEDROXYPROGESTERONE ACETATE 150 MG/ML IM SUSY
150.0000 mg | PREFILLED_SYRINGE | Freq: Once | INTRAMUSCULAR | Status: AC
  Administered 2023-03-31: 150 mg via INTRAMUSCULAR

## 2023-03-31 MED ORDER — MEDROXYPROGESTERONE ACETATE 150 MG/ML IM SUSP: 150.0000 mg | Freq: Once | INTRAMUSCULAR | 0 refills | Status: DC

## 2023-03-31 NOTE — Progress Notes (Signed)
[facility] [address] [place], [place] [address] Phone: [phone] Fax: [phone]   Office Visit Note  Patient Name: Megan Vang  Date of [id number]  Med Rec number [id number]  Date of Service: 03/31/2023  Kiwi extract and Peanut-containing drug products  No chief complaint on file.  HPI Patient is an 22 y.o. student here for depo injection. Last depo was on 01/05/23. Tolerates well. Got her cycle one time which lasted about 13 days. Able to get next injection 5/2-5/16.   Evaluated for sebaceous cyst on 03/10/2023 and treated with Doxy.  States that symptoms improved but did not fully go away.  Thinks that it may have become slightly larger over the past few days.  Slightly more bothersome and tender.  Current Medication:  Outpatient Encounter Medications as of 03/31/2023  Medication Sig   diphenhydrAMINE (BENADRYL) 50 MG tablet Take 1 tablet (50 mg total) by mouth in the morning, at noon, and at bedtime for 2 days.   doxycycline (VIBRA-TABS) 100 MG tablet Take 1 tablet (100 mg total) by mouth 2 (two) times daily.   EPINEPHrine 0.3 mg/0.3 mL IJ SOAJ injection Inject 0.3 mg into the muscle as needed for anaphylaxis.   escitalopram (LEXAPRO) 10 MG tablet Take 10 mg by mouth daily.   famotidine (PEPCID) 20 MG tablet Take 1 tablet (20 mg total) by mouth 2 (two) times daily for 3 days.   medroxyPROGESTERone (DEPO-PROVERA) 150 MG/ML injection Inject 1 mL (150 mg total) into the muscle once for 1 dose.   ondansetron (ZOFRAN) 4 MG tablet Take 1 tablet (4 mg total) by mouth every 8 (eight) hours as needed for nausea or vomiting.   Facility-Administered Encounter Medications as of 03/31/2023  Medication   medroxyPROGESTERone (DEPO-PROVERA) injection 150 mg    Medical History: No past medical history on file.  Vital Signs: There were no vitals taken for this visit.  ROS: As per HPI.  All other pertinent ROS negative.     Review of Systems  Genitourinary:   Positive for genital sores (Sabaceous cyst).    Physical Exam Vitals reviewed.  Constitutional:      Appearance: Normal appearance.  Genitourinary:    Labia:        Left: Lesion present.      Comments: Left sided labia majora fullness/firmness concerning for sebaceous cyst.  No fluctuant head.  No drainage.  No erythema or warmth. Neurological:     Mental Status: She is alert.     No results found for this or any previous visit (from the past 24 hour(s)).  Assessment/Plan: 1. Vaginal cyst -Symptoms improved with doxycycline.  Appears to have slightly gotten larger since treatment.  Recommend treating with Keflex 4 times daily x 10 days.  Discussed that sebaceous cyst can come and go and when they become bothersome is when to treat them.  Occasionally they will need to be drained.  Follow-up with home OB/GYN.  Will be in town until the end of the month.  Keep me updated and let me know if anything changes.  - cephALEXin (KEFLEX) 500 MG capsule; Take 1 capsule (500 mg total) by mouth 4 (four) times daily.  Dispense: 56 capsule; Refill: 0  2. Encounter for Depo-Provera contraception -She is within her window to receive her next Depo injection which was between May 2 and May 16.  She will be due for her next Depo while on summer break.  Will send prescription to home pharmacy.  - medroxyPROGESTERone Acetate  SUSY 150 mg - medroxyPROGESTERone (DEPO-PROVERA) 150 MG/ML injection; Inject 1 mL (150 mg total) into the muscle once for 1 dose.  Dispense: 1 mL; Refill: 0     General Counseling: Anaja verbalizes understanding of the findings of todays visit and agrees with plan of treatment. I have discussed any further diagnostic evaluation that may be needed or ordered today. We also reviewed her medications today. she has been encouraged to call the office with any questions or concerns that should arise related to todays visit.   No orders of the defined types were placed in this  encounter.   No orders of the defined types were placed in this encounter.   I spent 20 minutes dedicated to the care of this patient (face-to-face and non-face-to-face) on the date of the encounter to include what is described in the assessment and plan.   Durenda Hurt, NP 03/31/2023 1:15 PM

## 2023-04-01 ENCOUNTER — Ambulatory Visit: Payer: BC Managed Care – PPO | Admitting: Oncology

## 2023-06-19 ENCOUNTER — Other Ambulatory Visit: Payer: Self-pay | Admitting: Oncology

## 2023-06-19 DIAGNOSIS — Z3042 Encounter for surveillance of injectable contraceptive: Secondary | ICD-10-CM

## 2023-08-08 ENCOUNTER — Ambulatory Visit: Payer: BC Managed Care – PPO | Admitting: Oncology

## 2023-08-09 ENCOUNTER — Ambulatory Visit (INDEPENDENT_AMBULATORY_CARE_PROVIDER_SITE_OTHER): Payer: BC Managed Care – PPO | Admitting: Oncology

## 2023-08-09 ENCOUNTER — Encounter: Payer: Self-pay | Admitting: Oncology

## 2023-08-09 VITALS — BP 108/68 | HR 93 | Temp 99.9°F | Ht 68.0 in | Wt 169.0 lb

## 2023-08-09 DIAGNOSIS — R1033 Periumbilical pain: Secondary | ICD-10-CM | POA: Diagnosis not present

## 2023-08-09 MED ORDER — PANTOPRAZOLE SODIUM 20 MG PO TBEC: 20.0000 mg | DELAYED_RELEASE_TABLET | Freq: Every day | ORAL | 0 refills | Status: AC

## 2023-08-09 NOTE — Progress Notes (Signed)
[facility] [address] [place], [place] [address] Phone: [phone] Fax: [phone]   Office Visit Note  Patient Name: Megan Vang  Date of [id number]  Med Rec number [id number]  Date of Service: 08/09/2023  Kiwi extract and Peanut-containing drug products  Chief Complaint  Patient presents with   Abdominal Pain    Extremely intense pain and cramps after eating. Defecating does not help. X 3 weeks or so    HPI Patient is an 22 y.o. student here for complaints of abdoiminal pain X 3-4 weeks after she eats.  Pain is described as along the umbilicus extending down to her pelvis at times.  Symptoms started approximately 15 minutes to an hour after her last meal and can last up to an hour.  Often times, she has to lay down and let the symptoms resolve on their own.  Has some associated N/V and dizziness. Has been trying to track her foods but can't find a source.  Reports she does not live on campus so makes most of her meals at home with items she buys from the grocery store.  Very rarely does she eat in the dining hall.  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  Typically has a BM 3 times per week.  Her stool consistency is formed.  Bowel movements do not make her abdominal pain better.  Denies fevers.  Denies a change in her overall diet.  Has been drinking plenty of water.  No new medications.  Current Medication:  Outpatient Encounter Medications as of 08/09/2023  Medication Sig   medroxyPROGESTERone (DEPO-PROVERA) 150 MG/ML injection Inject 1 mL (150 mg total) into the muscle once for 1 dose.   pantoprazole (PROTONIX) 20 MG tablet Take 1 tablet (20 mg total) by mouth daily.   EPINEPHrine 0.3 mg/0.3 mL IJ SOAJ injection Inject 0.3 mg into the muscle as needed for anaphylaxis. (Patient not taking: Reported on 08/09/2023)   [DISCONTINUED] cephALEXin (KEFLEX) 500 MG capsule Take 1 capsule (500 mg total) by mouth 4  (four) times daily. (Patient not taking: Reported on 08/09/2023)   [DISCONTINUED] diphenhydrAMINE (BENADRYL) 50 MG tablet Take 1 tablet (50 mg total) by mouth in the morning, at noon, and at bedtime for 2 days.   [DISCONTINUED] doxycycline (VIBRA-TABS) 100 MG tablet Take 1 tablet (100 mg total) by mouth 2 (two) times daily. (Patient not taking: Reported on 08/09/2023)   [DISCONTINUED] escitalopram (LEXAPRO) 10 MG tablet Take 10 mg by mouth daily. (Patient not taking: Reported on 08/09/2023)   [DISCONTINUED] famotidine (PEPCID) 20 MG tablet Take 1 tablet (20 mg total) by mouth 2 (two) times daily for 3 days.   [DISCONTINUED] ondansetron (ZOFRAN) 4 MG tablet Take 1 tablet (4 mg total) by mouth every 8 (eight) hours as needed for nausea or vomiting. (Patient not taking: Reported on 08/09/2023)   Facility-Administered Encounter Medications as of 08/09/2023  Medication   medroxyPROGESTERone (DEPO-PROVERA) injection 150 mg    Medical History: No past medical history on file.   Vital Signs: BP 108/68   Pulse 93   Temp 99.9 F (37.7 C)   Ht 5\' 8"  (1.727 m)   Wt 169 lb (76.7 kg)   SpO2 98%   BMI 25.70 kg/m   ROS: As per HPI.  All other pertinent ROS negative.     Review of Systems  Constitutional:  Positive for fatigue. Negative for appetite change, chills, diaphoresis and unexpected weight change.  Gastrointestinal:  Positive for abdominal pain, constipation, nausea and vomiting. Negative for abdominal distention, blood in stool, diarrhea and rectal pain.    Physical Exam Vitals reviewed.  Constitutional:      Appearance: She is well-developed.  Abdominal:     General: Abdomen is flat. Bowel sounds are normal.     Tenderness: There is no abdominal tenderness.     Comments: No reproducible abdominal pain.  Neurological:     Mental Status: She is alert.     No results found for this or any previous visit (from the past 24 hour(s)).  Assessment/Plan: 1. Periumbilical abdominal  pain -We discussed differentials including acid reflux/GERD given symptoms have been following meals versus complication with her IBS versus others.  We discussed keeping a food diary as well to see if some foods make her symptoms worse than others.  She would like a referral to gastroenterology for additional workup.  We talked about starting with Protonix 20 mg once a day for the next week or so to see if symptoms improve and may increase to twice a day should she see notable improvement with the once a day dosing. I have recommended she keep me up-to-date on her symptoms and if she has any improvement with the Protonix versus worsening of her symptoms.  Patients Education: IBS/GERD Eat a healthy diet. Eat 5-6 small meals a day. Try to eat meals at about the same times each day. Do not eat large meals. Gradually eat more fiber-rich foods. These include whole grains, fruits, and vegetables. This may be especially helpful if you have IBS with constipation. Eat a diet low in FODMAPs. You may need to avoid foods such as citrus fruits, cabbage, garlic, and onions. Drink enough fluid to keep your urine pale yellow. Keep a journal of foods that seem to trigger symptoms. Avoid foods and drinks that: Contain added sugar. Make your symptoms worse. These may include dairy products, caffeinated drinks, and carbonated drinks. Alcohol use Do not drink alcohol if: Your health care provider tells you not to drink. You are pregnant, may be pregnant, or are planning to become pregnant. If you drink alcohol: Limit how much you have to: 0-1 drink a day for women. 0-2 drinks a day for men. Know how much alcohol is in your drink. In the [place] one drink equals one 12 oz bottle of beer (355 mL), one 5 oz glass of wine (148 mL), or one 1 oz glass of hard liquor (44 mL) General instructions Take over-the-counter and prescription medicines only as told by your health care provider. This includes supplements. Get  enough exercise. Do at least 150 minutes of moderate-intensity exercise each week. Manage your stress. Getting enough sleep and exercise can help you manage stress. Keep all follow-up visits. This is important. This includes all visits with your health care provider and therapist. - pantoprazole (PROTONIX) 20 MG tablet; Take 1 tablet (20 mg total) by mouth daily.  Dispense: 30 tablet; Refill: 0 - Ambulatory referral to Gastroenterology   General Counseling: Bellarose verbalizes understanding of the findings of todays visit and agrees with plan of treatment. I have discussed any further diagnostic evaluation that may be needed or ordered today. We also reviewed her medications today. she has been encouraged to call the office with any questions or concerns that should arise related to todays visit.   Orders Placed This Encounter  Procedures   Ambulatory referral to Gastroenterology    Meds ordered this encounter  Medications   pantoprazole (PROTONIX)  20 MG tablet    Sig: Take 1 tablet (20 mg total) by mouth daily.    Dispense:  30 tablet    Refill:  0    I spent 20 minutes dedicated to the care of this patient (face-to-face and non-face-to-face) on the date of the encounter to include what is described in the assessment and plan.   Durenda Hurt, NP 08/09/2023 12:36 PM

## 2023-08-29 ENCOUNTER — Encounter: Payer: Self-pay | Admitting: Oncology

## 2023-08-30 ENCOUNTER — Other Ambulatory Visit: Payer: Self-pay | Admitting: Oncology

## 2023-08-30 DIAGNOSIS — Z3042 Encounter for surveillance of injectable contraceptive: Secondary | ICD-10-CM

## 2023-08-30 MED ORDER — MEDROXYPROGESTERONE ACETATE 150 MG/ML IM SUSP: 150.0000 mg | INTRAMUSCULAR | 2 refills | Status: AC

## 2023-09-06 ENCOUNTER — Ambulatory Visit (INDEPENDENT_AMBULATORY_CARE_PROVIDER_SITE_OTHER): Payer: BC Managed Care – PPO | Admitting: Oncology

## 2023-09-06 VITALS — BP 118/72 | HR 73 | Temp 98.0°F | Resp 6 | Ht 68.0 in | Wt 168.0 lb

## 2023-09-06 DIAGNOSIS — Z3042 Encounter for surveillance of injectable contraceptive: Secondary | ICD-10-CM

## 2023-09-06 MED ORDER — MEDROXYPROGESTERONE ACETATE 150 MG/ML IM SUSY
150.0000 mg | PREFILLED_SYRINGE | Freq: Once | INTRAMUSCULAR | Status: AC
  Administered 2023-09-06: 150 mg via INTRAMUSCULAR

## 2023-09-06 NOTE — Progress Notes (Signed)
[facility] [address] [place], [place] [address] Phone: [phone] Fax: [phone]   Office Visit Note  Patient Name: Megan Vang  Date of [id number]  Med Rec number [id number]  Date of Service: 09/06/2023  Kiwi extract and Peanut-containing drug products  No chief complaint on file.    HPI Patient is an 22 y.o. student here for depo injection.   Had last depo injection on 06/23/23. States she has been getting her cycle about 2 weeks prior to when the next injection is due.  She spoke to her GYN during her last appointment who recommended she get her injection approximately 2 weeks prior to when it is due to hopefully reduce the amount of bleeding.  States she started to spot last week but not much.  She will be graduating from [employer] in December.  Current Medication:  Outpatient Encounter Medications as of 09/06/2023  Medication Sig   EPINEPHrine 0.3 mg/0.3 mL IJ SOAJ injection Inject 0.3 mg into the muscle as needed for anaphylaxis. (Patient not taking: Reported on 08/09/2023)   medroxyPROGESTERone (DEPO-PROVERA) 150 MG/ML injection Inject 1 mL (150 mg total) into the muscle every 3 (three) months.   pantoprazole (PROTONIX) 20 MG tablet Take 1 tablet (20 mg total) by mouth daily.   Facility-Administered Encounter Medications as of 09/06/2023  Medication   medroxyPROGESTERone (DEPO-PROVERA) injection 150 mg   [COMPLETED] medroxyPROGESTERone Acetate SUSY 150 mg    Medical History: No past medical history on file.   Vital Signs: BP 118/72 (BP Location: Right Arm)   Pulse 73   Temp 98 F (36.7 C) (Tympanic)   Resp (!) 6   Ht 5\' 8"  (1.727 m)   Wt 168 lb (76.2 kg)   BMI 25.54 kg/m   ROS: As per HPI.  All other pertinent ROS negative.     Review of Systems  Genitourinary:  Positive for menstrual problem and vaginal bleeding.    Physical Exam Constitutional:      General: She is not in acute distress.    Appearance: Normal appearance.   Neurological:     Mental Status: She is alert.     No results found for this or any previous visit (from the past 24 hour(s)).  Assessment/Plan: 1. Encounter for Depo-Provera contraception -Last Depo injection was given on 06/23/2023.  She has spoken to her GYN about receiving her Depo injection about 2 weeks early to see if this alleviates some of the bleeding she has been having. -Continues to tolerate Depo injections well except for spotting approximately 2 weeks prior to next injection. -Next injection will be due between December 31st and January 14th.  -She is graduating in December so we will likely have this done at home.  - medroxyPROGESTERone Acetate SUSY 150 mg   General Counseling: Darrion verbalizes understanding of the findings of todays visit and agrees with plan of treatment. I have discussed any further diagnostic evaluation that may be needed or ordered today. We also reviewed her medications today. she has been encouraged to call the office with any questions or concerns that should arise related to todays visit.   No orders of the defined types were placed in this encounter.   Meds ordered this encounter  Medications   medroxyPROGESTERone Acetate SUSY 150 mg    I spent 20 minutes dedicated to the care of this patient (face-to-face and non-face-to-face) on the date of the encounter to include what is described in the assessment and plan.   Durenda Hurt,  NP 09/06/2023 1:51 PM
